# Patient Record
Sex: Female | Born: 1995 | Race: White | Hispanic: No | Marital: Single | Smoking: Never smoker
Health system: Southern US, Community
[De-identification: ages and names within clinical notes are randomized; demographics above are authoritative.]

## PROBLEM LIST (undated history)

## (undated) DIAGNOSIS — F39 Unspecified mood [affective] disorder: Secondary | ICD-10-CM

## (undated) DIAGNOSIS — N920 Excessive and frequent menstruation with regular cycle: Secondary | ICD-10-CM

## (undated) HISTORY — DX: Excessive and frequent menstruation with regular cycle: N92.0

## (undated) HISTORY — DX: Unspecified mood (affective) disorder: F39

---
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
## 2019-07-23 ENCOUNTER — Telehealth: Payer: Self-pay

## 2019-07-23 NOTE — Telephone Encounter (Signed)
Patient called in to see if  Dr. Patsy Lager is accepting new patients.   Please follow up with the patient and let her know at [phone] thanks

## 2019-07-24 NOTE — Telephone Encounter (Signed)
Ok to schedule patient to see Dr. Patsy Lager
